# Patient Record
Sex: Female | Born: 1970
Health system: Southern US, Community
[De-identification: ages and names within clinical notes are randomized; demographics above are authoritative.]

## PROBLEM LIST (undated history)

## (undated) DIAGNOSIS — E7800 Pure hypercholesterolemia, unspecified: Secondary | ICD-10-CM

## (undated) DIAGNOSIS — IMO0002 Reserved for concepts with insufficient information to code with codable children: Secondary | ICD-10-CM

## (undated) HISTORY — DX: Pure hypercholesterolemia, unspecified: E78.00

## (undated) HISTORY — DX: Reserved for concepts with insufficient information to code with codable children: IMO0002

---

## 1993-01-29 HISTORY — PX: BREAST LUMPECTOMY: SHX2

## 2001-02-11 ENCOUNTER — Other Ambulatory Visit: Admission: RE | Admit: 2001-02-11 | Discharge: 2001-02-11 | Payer: Self-pay | Admitting: Gynecology

## 2002-03-02 ENCOUNTER — Other Ambulatory Visit: Admission: RE | Admit: 2002-03-02 | Discharge: 2002-03-02 | Payer: Self-pay | Admitting: Obstetrics and Gynecology

## 2002-11-13 ENCOUNTER — Encounter: Payer: Self-pay | Admitting: Obstetrics and Gynecology

## 2002-11-13 ENCOUNTER — Ambulatory Visit (HOSPITAL_COMMUNITY): Admission: RE | Admit: 2002-11-13 | Discharge: 2002-11-13 | Payer: Self-pay | Admitting: Obstetrics and Gynecology

## 2003-04-15 ENCOUNTER — Inpatient Hospital Stay (HOSPITAL_COMMUNITY): Admission: AD | Admit: 2003-04-15 | Discharge: 2003-04-17 | Payer: Self-pay | Admitting: Obstetrics and Gynecology

## 2003-04-21 ENCOUNTER — Encounter: Admission: RE | Admit: 2003-04-21 | Discharge: 2003-05-21 | Payer: Self-pay | Admitting: Obstetrics and Gynecology

## 2003-05-22 ENCOUNTER — Encounter: Admission: RE | Admit: 2003-05-22 | Discharge: 2003-06-21 | Payer: Self-pay | Admitting: Obstetrics and Gynecology

## 2003-05-26 ENCOUNTER — Other Ambulatory Visit: Admission: RE | Admit: 2003-05-26 | Discharge: 2003-05-26 | Payer: Self-pay | Admitting: Obstetrics and Gynecology

## 2004-01-11 ENCOUNTER — Emergency Department (HOSPITAL_COMMUNITY): Admission: EM | Admit: 2004-01-11 | Discharge: 2004-01-12 | Payer: Self-pay | Admitting: Emergency Medicine

## 2004-04-12 ENCOUNTER — Other Ambulatory Visit: Admission: RE | Admit: 2004-04-12 | Discharge: 2004-04-12 | Payer: Self-pay | Admitting: Obstetrics and Gynecology

## 2005-03-16 ENCOUNTER — Other Ambulatory Visit: Admission: RE | Admit: 2005-03-16 | Discharge: 2005-03-16 | Payer: Self-pay | Admitting: Obstetrics and Gynecology

## 2006-02-11 ENCOUNTER — Inpatient Hospital Stay (HOSPITAL_COMMUNITY): Admission: AD | Admit: 2006-02-11 | Discharge: 2006-02-11 | Payer: Self-pay | Admitting: Obstetrics and Gynecology

## 2006-02-18 ENCOUNTER — Inpatient Hospital Stay (HOSPITAL_COMMUNITY): Admission: AD | Admit: 2006-02-18 | Discharge: 2006-02-20 | Payer: Self-pay | Admitting: Obstetrics and Gynecology

## 2006-02-21 ENCOUNTER — Encounter: Admission: RE | Admit: 2006-02-21 | Discharge: 2006-03-14 | Payer: Self-pay | Admitting: Obstetrics and Gynecology

## 2007-12-28 ENCOUNTER — Emergency Department (HOSPITAL_COMMUNITY): Admission: EM | Admit: 2007-12-28 | Discharge: 2007-12-28 | Payer: Self-pay | Admitting: Family Medicine

## 2008-11-15 ENCOUNTER — Encounter: Admission: RE | Admit: 2008-11-15 | Discharge: 2008-11-15 | Payer: Self-pay | Admitting: Family Medicine

## 2008-11-23 ENCOUNTER — Encounter: Admission: RE | Admit: 2008-11-23 | Discharge: 2008-11-23 | Payer: Self-pay | Admitting: Family Medicine

## 2008-12-21 ENCOUNTER — Encounter: Admission: RE | Admit: 2008-12-21 | Discharge: 2008-12-21 | Payer: Self-pay | Admitting: Orthopedic Surgery

## 2009-01-03 ENCOUNTER — Encounter: Admission: RE | Admit: 2009-01-03 | Discharge: 2009-01-03 | Payer: Self-pay | Admitting: Orthopedic Surgery

## 2009-12-13 ENCOUNTER — Encounter: Admission: RE | Admit: 2009-12-13 | Discharge: 2009-12-13 | Payer: Self-pay | Admitting: Orthopedic Surgery

## 2009-12-27 ENCOUNTER — Encounter: Admission: RE | Admit: 2009-12-27 | Discharge: 2009-12-27 | Payer: Self-pay | Admitting: Orthopedic Surgery

## 2010-06-16 NOTE — H&P (Signed)
[name], [name]                          ACCOUNT NO.:  [id number]   MEDICAL RECORD NO.:  [id number]                   PATIENT TYPE:  MAT   LOCATION:  [facility]                                 [facility]ILITY:  [facility]   PHYSICIAN:  Crist Fat. Rivard, M.D.              DATE OF BIRTH:  October 26, 1970   DATE OF ADMISSION:  04/15/2003  DATE OF DISCHARGE:                                HISTORY & PHYSICAL   The patient is to be admitted on April 15, 2003, for induction of labor on  the birthing suite.   Michele [name] is a 40 year old gravida 1, para 0, at 40-4/7 weeks, who presents  today for induction secondary to term and mild PIH.  Her has been remarkable  for:   1. Abnormal last menstrual period.  2. Increased Down syndrome risk on quadruple screen with normal ultrasound     but amniocentesis declined.  3. Positive group B strep.    PRENATAL LABORATORY DATA:  Blood type is A positive.  Rh antibody negative.  VDRL nonreactive.  Rubella titer positive.  Hepatitis B surface antigen  negative.  Cystic fibrosis testing was negative.  Pap was normal.  Glucose  challenge was normal.  The patient had an abnormal quadruple screen with an  elevated Down syndrome risk; however, she had a normal ultrasound and  declined amniocentesis.  Her Glucola was normal.  Group B strep culture was  positive at 36 weeks.  Cultures were also negative at 36 weeks.  EDC of  April 11, 2003, was established by last menstrual period and was in  agreement with ultrasound at  12 and 18 weeks.  Hemoglobin upon entering the  practice was 13.4.  It was 11.5 at 28 weeks.   HISTORY OF PRESENT PREGNANCY:  The patient entered care at approximately 10  weeks.  She had an ultrasound at 12 weeks for dating secondary to abnormal  LMP.  She had another ultrasound at 12-1/2 weeks secondary to an inability  to hear fetal heart tones due to retroverted uterus.  Quadruple screen was  done.  It did show elevated Down syndrome risk.  She then had a  level 2  ultrasound at [facility], which was normal.  She declined  amniocentesis.  She had a Glucola that was normal.  At 26 weeks she had an  evaluation for slight erythema from her waistline to the hips.  This was  determined to be within normal limits.  She had a normal Glucola.  At  approximately 35-36 weeks she began to have slight elevations of her blood  pressure in the high 80s to low 90s diastolic.  She never had protein in her  urine.  The decision was made to plan induction at 40-4/7 weeks.   PAST OBSTETRICAL HISTORY:  The patient is a primigravida.   PAST MEDICAL HISTORY:  She was a condom and OvCon-35 user.  She had an  abnormal Pap six years ago and had a colposcopy.  She has had normal Paps  since.  She has infrequent yeast infections.  She reports the usual  childhood illnesses.  She has occasional history of UTIs.   She has no known medication allergies.   FAMILY HISTORY:  Her aunt had breast cancer.  Her mother has a history of  depression.   GENETIC HISTORY:  Remarkable for the father of the baby having scoliosis and  the patient's aunt having multiple sclerosis.   The patient has been followed by the physician service at [facility]  She denies any alcohol, drug, or tobacco use during this pregnancy.   PHYSICAL EXAMINATION:  VITAL SIGNS:  Blood pressure in the office on last  exam was 152/86, weight was 212.  Other vital signs are stable.  HEENT:  Within normal limits.  CHEST:  Bilateral breath sounds are clear.  CARDIAC:  Regular rate and rhythm without murmur.  BREASTS:  Soft and nontender.  ABDOMEN:  Fundal height is approximately 39 cm and is gravid.  Fetal heart  tones have been in the 150s in the office.  There have been occasional  uterine contractions noted.  EXTREMITIES:  Deep tendon reflexes are 2+ without clonus.  There is no edema  noted.  PELVIC:  Last documented was closed, 50%, vertex at a -1 at approximately 39  weeks.    IMPRESSION:  1. Intrauterine pregnancy at 40-4/7 weeks.  2. Mild elevation of blood pressure.  3. Slight post dates.  4. Positive group B Streptococcus.  5. Increased risk of Down syndrome on quadruple screen.   PLAN:  1. Admit to birthing suite per consult with Dr. Silverio Lay as attending     physician.  2. Routine physician orders.  3. Dr. Estanislado Pandy will determine mode of induction but will likely include     Pitocin induction of labor.     Michele [name], C.N.M.                   Crist Fat Rivard, M.D.    Leeanne Mannan  D:  04/14/2003  T:  04/14/2003  Job:  [id number]

## 2010-06-16 NOTE — H&P (Signed)
NAMEMARVIS, [name]              ACCOUNT NO.:  [id number]   MEDICAL RECORD NO.:  [id number]          PATIENT TYPE:  INP   LOCATION:  9164                          [facility]ILITY:  [facility]   PHYSICIAN:  Hal Morales, M.D.DATE OF BIRTH:  08-14-1970   DATE OF ADMISSION:  02/18/2006  DATE OF DISCHARGE:                              HISTORY & PHYSICAL   Michele [name] is a 40 year old, married, white female, gravida 2, para 1-0-0-  1, at 39-4/7 weeks who presents with ruptures membranes with clear  fluids since 11:50 p.m. tonight followed by regular contractions. She  reports positive fetal movement and no signs or symptoms of PIH, no  nausea or vomiting, or diarrhea.  Her pregnancy has been followed by the  [facility] OB[facility] MD service and has been remarkable for:  1. Advanced maternal age.  2. Conception just after discontinuing oral contraception.  3. History of borderline hypertension.  4. Group B strep positive.   PRENATAL LABS:  Collected on July 09, 2005. Hemoglobin 14.5, hematocrit  45.5, platelets 275,000, blood type A positive, antibody negative.  RPR  nonreactive.  Rubella immune.  Hepatitis B surface antigen negative.  Cystic fibrosis negative from 2005.  Pap smear within normal limits from  February 2007.  First trimester screening was normal in July 2007.  One-  hour Glucola from November 28, 2005 was 119.  Culture of the vaginal  tract for group B strep, gonorrhea and Chlamydia from January 25, 2006.  Group B strep was positive. Gonorrhea and Chlamydia were negative.   HISTORY OF PRESENT PREGNANCY:  The patient presented for care at [facility] on July 09, 2005 at 7-4/[redacted] weeks gestation.  This was done by  ultrasound at this first visit.  The patient declined amniocentesis. The  patient had first trimester screening at [facility] which was  normal.  Ultrasonography at [redacted] weeks gestation shows growth consistent  with previous dating, confirming [facility] of February 21, 2006.  The patient  had normal AFP drawn at [redacted] weeks gestation.  The rest of her prenatal  care has been unremarkable.   OB HISTORY:  She is a gravida 2, para 1-0-0-1.  In March 2005, she had a  vaginal delivery of a female infant weighing 8 pounds at 40-4/[redacted] weeks  gestation after eight hours of labor.  She had an epidural for  anesthesia.  Infant's name was Fredric Mare. She had group B strep with that  pregnancy.  She was induced and she had a partial third degree  laceration. She also had mild elevated blood pressure in the third  trimester that did not require treatment.   ALLERGIES:  She has no medications allergies.   PAST MEDICAL HISTORY:  She experience menarche at the age of 34 with 28-  day cycles lasting three days.  She stopped oral contraceptives at the  end of March due to slightly elevated blood pressure.  She had an  abnormal Pap smear approximately eight years ago with normal  colposcopies and  normal Pap smear since.  She reports having had the  usual childhood illnesses.  She has occasional urinary tract infection.   PAST SURGICAL HISTORY:  Negative.   FAMILY HISTORY:  Remarkable for mother with hypertension, paternal aunt  with breast cancer, mother with history of depression.   GENETIC HISTORY:  Remarkable for the patient at age 17 at the time of  delivery and the father of the baby's aunt with MS.   SOCIAL HISTORY:  The patient is married to the father of the baby.  His  name is Thayer Ohm.  He is involved and supportive.  The patient has 13-1/2  years of education and is employed full-time at [employer].  Father of the baby has 14 years of education and is employed full-time  as a Company secretary.  They deny any alcohol, tobacco or illicit drug use with  the pregnancy.   PHYSICAL EXAMINATION:  VITAL SIGNS:  Blood pressure 128/72.  Other vital  signs are stable.  She is afebrile.  HEENT:  Grossly within normal limits.  CHEST:  Clear to auscultation.  HEART:  Regular  rate and rhythm.  ABDOMEN:  Gravid in contour.  Fundal height extending approximately 39  cm by pubic symphysis.  Fetal heart rate is reassuring in the 140s.  Positive accelerations.  No decelerations.  Contractions are every three  minutes.  PELVIC:  Cervix is 3-4 cm, 70%, effaced.  Vertex minus 2.  With copious  clear fluid present.  EXTREMITIES:  Remarkable for trace edema.   ASSESSMENT:  1. Intrauterine pregnancy at term.  2. Spontaneous rupture of membranes.  3. Early labor.  4. Group B strep positive   PLAN:  1. Admit to the birthing suite.  2. Routine MD orders.  3. Plan penicillin G for group B strep prophylaxis.  4. Anticipate normal spontaneous vaginal birth.      Cam Hai, C.N.M.      Hal Morales, M.D.  Electronically Signed    KS/MEDQ  D:  02/18/2006  T:  02/18/2006  Job:  [id number]

## 2010-11-02 ENCOUNTER — Other Ambulatory Visit: Payer: Self-pay | Admitting: Family Medicine

## 2010-11-02 DIAGNOSIS — R42 Dizziness and giddiness: Secondary | ICD-10-CM

## 2010-11-03 ENCOUNTER — Ambulatory Visit
Admission: RE | Admit: 2010-11-03 | Discharge: 2010-11-03 | Disposition: A | Discharge status: Home / Self Care | Payer: 59 | Source: Ambulatory Visit | Attending: Family Medicine | Admitting: Family Medicine

## 2010-11-03 DIAGNOSIS — R42 Dizziness and giddiness: Secondary | ICD-10-CM

## 2011-05-31 ENCOUNTER — Ambulatory Visit: Payer: Self-pay | Admitting: Obstetrics and Gynecology

## 2011-06-07 ENCOUNTER — Other Ambulatory Visit (HOSPITAL_COMMUNITY): Payer: Self-pay | Admitting: Physician Assistant

## 2011-06-07 DIAGNOSIS — Z1231 Encounter for screening mammogram for malignant neoplasm of breast: Secondary | ICD-10-CM

## 2011-07-03 ENCOUNTER — Ambulatory Visit (HOSPITAL_COMMUNITY)
Admission: RE | Admit: 2011-07-03 | Discharge: 2011-07-03 | Disposition: A | Discharge status: Home / Self Care | Payer: 59 | Source: Ambulatory Visit | Attending: Physician Assistant | Admitting: Physician Assistant

## 2011-07-03 DIAGNOSIS — Z1231 Encounter for screening mammogram for malignant neoplasm of breast: Secondary | ICD-10-CM | POA: Insufficient documentation

## 2011-10-04 ENCOUNTER — Other Ambulatory Visit: Payer: Self-pay

## 2011-10-04 MED ORDER — BUPROPION HCL ER (XL) 300 MG PO TB24: 300.0000 mg | ORAL_TABLET | Freq: Every day | ORAL | Status: DC

## 2011-10-04 NOTE — Telephone Encounter (Signed)
Bupropion xl 300mg  faxed to [facility] [address].  Number 30 only no refills.  Pt needs AEX.  ld

## 2011-11-14 ENCOUNTER — Ambulatory Visit (INDEPENDENT_AMBULATORY_CARE_PROVIDER_SITE_OTHER): Payer: 59 | Admitting: Obstetrics and Gynecology

## 2011-11-14 ENCOUNTER — Encounter: Payer: Self-pay | Admitting: Obstetrics and Gynecology

## 2011-11-14 VITALS — BP 140/70 | Wt 195.0 lb

## 2011-11-14 DIAGNOSIS — E7800 Pure hypercholesterolemia, unspecified: Secondary | ICD-10-CM | POA: Insufficient documentation

## 2011-11-14 DIAGNOSIS — Z01419 Encounter for gynecological examination (general) (routine) without abnormal findings: Secondary | ICD-10-CM

## 2011-11-14 DIAGNOSIS — IMO0002 Reserved for concepts with insufficient information to code with codable children: Secondary | ICD-10-CM | POA: Insufficient documentation

## 2011-11-14 DIAGNOSIS — Z124 Encounter for screening for malignant neoplasm of cervix: Secondary | ICD-10-CM

## 2011-11-14 MED ORDER — BUPROPION HCL ER (XL) 300 MG PO TB24: 300.0000 mg | ORAL_TABLET | Freq: Every day | ORAL | Status: AC

## 2011-11-14 NOTE — Progress Notes (Signed)
The patient reports:no complaints  Contraception:vasectomy  Last mammogram: was normal August 2013 Last pap: was normal May  2012   GC/Chlamydia cultures offered: declined HIV/RPR/HbsAg offered:  declined HSV 1 and 2 glycoprotein offered: declined  Menstrual cycle regular and monthly: Yes Menstrual flow normal: Yes  Urinary symptoms: none Normal bowel movements: Yes Reports abuse at home: No  Subjective:    Michele [name] is a 41 y.o. female, G2P2, who presents for an annual exam.     History   Social History  . Marital Status: Married    Spouse Name: N/A    Number of Children: N/A  . Years of Education: N/A   Social History Main Topics  . Smoking status: Never Smoker   . Smokeless tobacco: Never Used  . Alcohol Use: No  . Drug Use: No  . Sexually Active: Yes    Birth Control/ Protection: Surgical     VAS   Other Topics Concern  . None   Social History Narrative  . None    Menstrual cycle:   LMP: Patient's last menstrual period was 10/24/2011.           Cycle: normal  The following portions of the patient's history were reviewed and updated as appropriate: allergies, current medications, past family history, past medical history, past social history, past surgical history and problem list.  Review of Systems Pertinent items are noted in HPI. Breast:Negative for breast lump,nipple discharge or nipple retraction Gastrointestinal: Negative for abdominal pain, change in bowel habits or rectal bleeding Urinary:negative   Objective:    BP 140/70  Wt 195 lb (88.451 kg)  LMP 10/24/2011    Weight:  Wt Readings from Last 1 Encounters:  11/14/11 195 lb (88.451 kg)          BMI: There is no height on file to calculate BMI.  General Appearance: Alert, appropriate appearance for age. No acute distress HEENT: Grossly normal Neck / Thyroid: Supple, no masses, nodes or enlargement Lungs: clear to auscultation bilaterally Back: No CVA tenderness Breast Exam: No  masses or nodes.No dimpling, nipple retraction or discharge. Cardiovascular: Regular rate and rhythm. S1, S2, no murmur Gastrointestinal: Soft, non-tender, no masses or organomegaly Pelvic Exam: Vulva and vagina appear normal. Bimanual exam reveals normal uterus and adnexa. Rectovaginal: normal rectal, no masses Lymphatic Exam: Non-palpable nodes in neck, clavicular, axillary, or inguinal regions Skin: no rash or abnormalities Neurologic: Normal gait and speech, no tremor  Psychiatric: Alert and oriented, appropriate affect.     Assessment:    Normal gyn exam    Plan:    mammogram pap smear return annually or prn STD screening: declined    Silverio Lay MD

## 2012-10-14 ENCOUNTER — Other Ambulatory Visit (HOSPITAL_COMMUNITY): Payer: Self-pay | Admitting: Family Medicine

## 2012-10-14 DIAGNOSIS — Z1231 Encounter for screening mammogram for malignant neoplasm of breast: Secondary | ICD-10-CM

## 2012-10-17 ENCOUNTER — Ambulatory Visit (HOSPITAL_COMMUNITY)
Admission: RE | Admit: 2012-10-17 | Discharge: 2012-10-17 | Disposition: A | Discharge status: Home / Self Care | Payer: 59 | Source: Ambulatory Visit | Attending: Family Medicine | Admitting: Family Medicine

## 2012-10-17 DIAGNOSIS — Z1231 Encounter for screening mammogram for malignant neoplasm of breast: Secondary | ICD-10-CM | POA: Insufficient documentation

## 2013-10-30 ENCOUNTER — Other Ambulatory Visit (HOSPITAL_COMMUNITY): Payer: Self-pay | Admitting: Obstetrics and Gynecology

## 2013-10-30 DIAGNOSIS — Z1231 Encounter for screening mammogram for malignant neoplasm of breast: Secondary | ICD-10-CM

## 2013-11-06 ENCOUNTER — Ambulatory Visit (HOSPITAL_COMMUNITY)
Admission: RE | Admit: 2013-11-06 | Discharge: 2013-11-06 | Disposition: A | Discharge status: Home / Self Care | Payer: 59 | Source: Ambulatory Visit | Attending: Obstetrics and Gynecology | Admitting: Obstetrics and Gynecology

## 2013-11-06 DIAGNOSIS — Z1231 Encounter for screening mammogram for malignant neoplasm of breast: Secondary | ICD-10-CM | POA: Insufficient documentation

## 2013-11-30 ENCOUNTER — Encounter: Payer: Self-pay | Admitting: Obstetrics and Gynecology

## 2014-11-15 ENCOUNTER — Other Ambulatory Visit: Payer: Self-pay | Admitting: Orthopedic Surgery

## 2014-11-15 DIAGNOSIS — IMO0002 Reserved for concepts with insufficient information to code with codable children: Secondary | ICD-10-CM

## 2014-11-15 DIAGNOSIS — R229 Localized swelling, mass and lump, unspecified: Principal | ICD-10-CM

## 2014-11-30 ENCOUNTER — Inpatient Hospital Stay
Admission: RE | Admit: 2014-11-30 | Discharge: 2014-11-30 | Disposition: A | Discharge status: Home / Self Care | Payer: Self-pay | Source: Ambulatory Visit | Attending: Orthopedic Surgery | Admitting: Orthopedic Surgery

## 2014-12-04 ENCOUNTER — Ambulatory Visit
Admission: RE | Admit: 2014-12-04 | Discharge: 2014-12-04 | Disposition: A | Discharge status: Home / Self Care | Payer: Commercial Managed Care - HMO | Source: Ambulatory Visit | Attending: Orthopedic Surgery | Admitting: Orthopedic Surgery

## 2014-12-04 DIAGNOSIS — IMO0002 Reserved for concepts with insufficient information to code with codable children: Secondary | ICD-10-CM

## 2014-12-04 DIAGNOSIS — R229 Localized swelling, mass and lump, unspecified: Principal | ICD-10-CM

## 2016-04-12 ENCOUNTER — Other Ambulatory Visit: Payer: Self-pay | Admitting: Obstetrics and Gynecology

## 2016-04-12 DIAGNOSIS — N644 Mastodynia: Secondary | ICD-10-CM

## 2016-04-16 ENCOUNTER — Ambulatory Visit
Admission: RE | Admit: 2016-04-16 | Discharge: 2016-04-16 | Disposition: A | Discharge status: Home / Self Care | Payer: 59 | Source: Ambulatory Visit | Attending: Obstetrics and Gynecology | Admitting: Obstetrics and Gynecology

## 2016-04-16 ENCOUNTER — Other Ambulatory Visit: Payer: Self-pay | Admitting: Obstetrics and Gynecology

## 2016-04-16 DIAGNOSIS — N644 Mastodynia: Secondary | ICD-10-CM

## 2016-04-26 ENCOUNTER — Other Ambulatory Visit: Payer: Self-pay | Admitting: Surgery

## 2016-04-26 DIAGNOSIS — N6452 Nipple discharge: Secondary | ICD-10-CM | POA: Insufficient documentation

## 2016-05-11 ENCOUNTER — Other Ambulatory Visit: Payer: Self-pay | Admitting: Surgery

## 2016-05-11 DIAGNOSIS — N6452 Nipple discharge: Secondary | ICD-10-CM

## 2016-05-22 ENCOUNTER — Ambulatory Visit
Admission: RE | Admit: 2016-05-22 | Discharge: 2016-05-22 | Disposition: A | Discharge status: Home / Self Care | Payer: BLUE CROSS/BLUE SHIELD | Source: Ambulatory Visit | Attending: Surgery | Admitting: Surgery

## 2016-05-22 DIAGNOSIS — N6452 Nipple discharge: Secondary | ICD-10-CM

## 2016-05-22 MED ORDER — GADOBENATE DIMEGLUMINE 529 MG/ML IV SOLN
20.0000 mL | Freq: Once | INTRAVENOUS | Status: AC | PRN
  Administered 2016-05-22: 20 mL via INTRAVENOUS

## 2016-05-24 NOTE — Progress Notes (Signed)
Please call the patient and let them know that their MRI was normal.  I would be glad to see her in the office to discuss duct excision if she wants to pursue that for continued nipple drainage

## 2016-05-26 ENCOUNTER — Ambulatory Visit: Payer: Self-pay | Admitting: Surgery

## 2016-05-26 NOTE — H&P (Signed)
History of Present Illness Michele [name]. [name] MD; 05/26/2016 10:32 PM) The patient is a 46 year old female who presents with a complaint of nipple discharge. Referred by Dr. [name] for right nipple discharge. GYN - Rivard  This is a 46 year old female who recently underwent routine screening mammogram in December was unremarkable. Over the last several weeks, she has noticed some clear nipple discharge spontaneously draining from her right breast. The patient reports twinges of sharp pain radiating toward her right nipple lasting 5-10 minutes. The pain will spontaneously resolve and then the patient notices some clear drainage shortly after. She underwent a diagnostic mammogram and ultrasound on 04/16/16. This showed no evidence for malignancy. She has a simple cyst measuring 1 cm in greatest dimension located at 2:00 of the right breast 2 cm from the nipple. There are no dilated ducts seen.  The patient was seen 04/26/16 and her examination was unremarkable except for a prominent central duct in the right nipple with some whitish drainage. She underwent MRI of both breasts which were also unremarkable.  Menarche age 68 First pregnancy age 44 Breast-feed no Oral contraceptives 15 years Last menstrual period 2 weeks ago. Family history a maternal aunt had cancer at age 67   CLINICAL DATA: Patient reports a 2 week history of spontaneous intermittent clear nipple discharge from the right breast.  EXAM: 2D DIGITAL DIAGNOSTIC RIGHT MAMMOGRAM WITH CAD AND ADJUNCT TOMO  ULTRASOUND RIGHT BREAST  COMPARISON: To 01/10/2016 and earlier  ACR Breast Density Category c: The breast tissue is heterogeneously dense, which may obscure small masses.  FINDINGS: There is a circumscribed partially obscured oval mass in the medial aspect of the right breast, further evaluated with ultrasound. No suspicious distortion or microcalcifications are present.  Mammographic images were processed with  CAD.  On physical exam, I palpate no abnormality in the central portion of the right breast. I palpate no abnormality medial aspect of the right breast. On physical exam, no discharge can be expressed from the right nipple.  Targeted ultrasound is performed, showing normal appearing fibroglandular tissue in the retroareolar region of the right breast. No duct ectasia or intraductal mass identified. Note is made of a simple cyst in the 2 o'clock location of the right breast 2 cm from the nipple measuring 1.0 x 0.8 x 0.3 cm, accounting for the mammographic finding.  IMPRESSION: 1. No mammographic or ultrasound evidence for malignancy. 2. Simple cyst in the 2 o'clock location of the right breast. 3. No mammographic or sonographic findings to account for the patient's spontaneous discharge. Further evaluation is indicated to exclude malignancy or high risk lesion.  RECOMMENDATION: Breast MRI to evaluate potential site for biopsy.  Surgical consultation to discuss duct excision.  I have discussed the findings and recommendations with the patient and her husband. Results were also provided in writing at the conclusion of the visit. If applicable, a reminder letter will be sent to the patient regarding the next appointment.  BI-RADS CATEGORY 4: Suspicious.   Electronically Signed By: [name] M.D. On: 04/16/2016 15:46  Study Result  CLINICAL DATA: Spontaneous clear and milky nipple discharge from the right nipple for 2 months. Family history of breast cancer in her maternal aunt, diagnosed at age 46.  LABS: Not applicable  EXAM: BILATERAL BREAST MRI WITH AND WITHOUT CONTRAST  TECHNIQUE: Multiplanar, multisequence MR images of both breasts were obtained prior to and following the intravenous administration of 20 ml of MultiHance.  THREE-DIMENSIONAL MR IMAGE RENDERING ON INDEPENDENT WORKSTATION:  Three-dimensional MR images were rendered by post-processing  of the original MR data on an independent workstation. The three-dimensional MR images were interpreted, and findings are reported in the following complete MRI report for this study. Three dimensional images were evaluated at the independent DynaCad workstation  COMPARISON: Mammogram and ultrasound 04/16/2016 and earlier  FINDINGS: Breast composition: c. Heterogeneous fibroglandular tissue.  Background parenchymal enhancement: Mild  Right breast: No mass or abnormal enhancement. Scattered cysts are present. Scattered cysts are present.  Left breast: No mass or abnormal enhancement.  Lymph nodes: No abnormal appearing lymph nodes.  Ancillary findings: None.  IMPRESSION: No MRI evidence for malignancy in either breast.  RECOMMENDATION: Follow-up with Dr. Corliss Skains regarding further recommendations. Screening mammogram is recommended in March of 2019.  BI-RADS CATEGORY 1: Negative.   Electronically Signed By: [name] M.D. On: 05/23/2016 10:35    Allergies [name], RMA; 05/25/2016 11:05 AM) CeleBREX *ANALGESICS - ANTI-INFLAMMATORY* Hives. Keflex *CEPHALOSPORINS* Nausea. pain  Medication History [name], RMA; 05/25/2016 11:06 AM) BuPROPion HCl ER (XL) (  Tablet ER 24HR, Oral) Active. Flonase (50MCG/ACT Suspension, Nasal) Active. Fish Oil (  Capsule, Oral) Active. Womens Multivitamin (Oral) Active. Meclizine HCl (  Tablet, Oral) Active. Xanax (0.5MG  Tablet, Oral as needed) Active. Medications Reconciled    Vitals [name] RMA; 05/25/2016 11:07 AM) 05/25/2016 11:06 AM Weight Measurement Declined Temp.: 98.55F  Pulse: 84 (Regular)  BP: 128/90 (Sitting, Left Arm, Standard)      Physical Exam [name] MD; 05/26/2016 10:33 PM)  The physical exam findings are as follows: Note:WDWN in NAD Eyes: Pupils equal, round; sclera anicteric HENT: Oral mucosa moist; good dentition Neck: No masses  palpated, no thyromegaly Lungs: CTA bilaterally; normal respiratory effort Breasts: symmetric; no axillary lymphadenopathy; no dominant masses; normal left nipple; right nipple shows some scaling and crusting at upper part of nipple. The central duct orifice seems mildly prominent CV: Regular rate and rhythm; no murmurs; extremities well-perfused with no edema Abd: +bowel sounds, soft, non-tender, no palpable organomegaly; no palpable hernias Skin: Warm, dry; no sign of jaundice Psychiatric - alert and oriented x 4; calm mood and affect    Assessment & Plan Molli Hazard K. Janiyla Long MD; 05/26/2016 10:35 PM)  DISCHARGE FROM RIGHT NIPPLE (N64.52)  Current Plans Schedule for Surgery - Right nipple duct excision. The surgical procedure has been discussed with the patient. Potential risks, benefits, alternative treatments, and expected outcomes have been explained. All of the patient's questions at this time have been answered. The likelihood of reaching the patient's treatment goal is good. The patient understand the proposed surgical procedure and wishes to proceed. Note:After extensive discussion with the patient and her husband, the only options remaining are rechecking in 3 months to see whether the discharge has resolved, or nipple duct excision. The patient is leaning towards duct excision, but wants to think about it over the weekend. We will proceed with scheduling and she will let us know if she changes her mind.   Michele [name]. Corliss Skains, MD, [facility]  General/ Trauma Surgery  05/26/2016 10:36 PM

## 2016-10-24 ENCOUNTER — Other Ambulatory Visit: Payer: Self-pay | Admitting: Family Medicine

## 2016-10-24 DIAGNOSIS — R519 Headache, unspecified: Secondary | ICD-10-CM

## 2016-10-24 DIAGNOSIS — R51 Headache: Principal | ICD-10-CM

## 2016-10-31 ENCOUNTER — Other Ambulatory Visit: Payer: BLUE CROSS/BLUE SHIELD

## 2017-02-22 DIAGNOSIS — Z0100 Encounter for examination of eyes and vision without abnormal findings: Secondary | ICD-10-CM | POA: Diagnosis not present

## 2017-03-07 DIAGNOSIS — J329 Chronic sinusitis, unspecified: Secondary | ICD-10-CM | POA: Diagnosis not present

## 2017-04-30 DIAGNOSIS — M545 Low back pain: Secondary | ICD-10-CM | POA: Diagnosis not present

## 2017-05-08 DIAGNOSIS — M545 Low back pain: Secondary | ICD-10-CM | POA: Diagnosis not present

## 2017-05-28 DIAGNOSIS — M5126 Other intervertebral disc displacement, lumbar region: Secondary | ICD-10-CM | POA: Diagnosis not present

## 2017-05-28 DIAGNOSIS — M25551 Pain in right hip: Secondary | ICD-10-CM | POA: Diagnosis not present

## 2017-05-30 DIAGNOSIS — M25551 Pain in right hip: Secondary | ICD-10-CM | POA: Diagnosis not present

## 2017-10-30 DIAGNOSIS — Z23 Encounter for immunization: Secondary | ICD-10-CM | POA: Diagnosis not present

## 2017-10-30 DIAGNOSIS — Z13220 Encounter for screening for lipoid disorders: Secondary | ICD-10-CM | POA: Diagnosis not present

## 2017-10-30 DIAGNOSIS — Z0000 Encounter for general adult medical examination without abnormal findings: Secondary | ICD-10-CM | POA: Diagnosis not present

## 2021-02-21 ENCOUNTER — Other Ambulatory Visit (HOSPITAL_COMMUNITY): Payer: Self-pay

## 2021-02-21 MED ORDER — AMPHETAMINE-DEXTROAMPHET ER 20 MG PO CP24
20.0000 mg | ORAL_CAPSULE | Freq: Every morning | ORAL | 0 refills | Status: AC
  Filled 2021-02-21: qty 30, 30d supply, fill #0

## 2021-03-01 ENCOUNTER — Other Ambulatory Visit (HOSPITAL_COMMUNITY): Payer: Self-pay

## 2021-03-06 ENCOUNTER — Other Ambulatory Visit: Payer: Self-pay | Admitting: Obstetrics and Gynecology

## 2021-03-06 DIAGNOSIS — R928 Other abnormal and inconclusive findings on diagnostic imaging of breast: Secondary | ICD-10-CM

## 2021-04-17 ENCOUNTER — Other Ambulatory Visit: Payer: Self-pay | Admitting: Obstetrics and Gynecology

## 2021-04-18 ENCOUNTER — Other Ambulatory Visit: Payer: Self-pay | Admitting: Obstetrics and Gynecology

## 2021-04-18 DIAGNOSIS — R928 Other abnormal and inconclusive findings on diagnostic imaging of breast: Secondary | ICD-10-CM

## 2021-04-29 ENCOUNTER — Ambulatory Visit
Admission: RE | Admit: 2021-04-29 | Discharge: 2021-04-29 | Disposition: A | Discharge status: Home / Self Care | Payer: No Typology Code available for payment source | Source: Ambulatory Visit | Attending: Obstetrics and Gynecology | Admitting: Obstetrics and Gynecology

## 2021-04-29 ENCOUNTER — Ambulatory Visit
Admission: RE | Admit: 2021-04-29 | Discharge: 2021-04-29 | Disposition: A | Discharge status: Home / Self Care | Payer: Self-pay | Source: Ambulatory Visit | Attending: Obstetrics and Gynecology | Admitting: Obstetrics and Gynecology

## 2021-04-29 DIAGNOSIS — R928 Other abnormal and inconclusive findings on diagnostic imaging of breast: Secondary | ICD-10-CM

## 2022-07-17 IMAGING — US US BREAST*L* LIMITED INC AXILLA
1 series · 12 of 12 positions shown · non-contrast
Comparison: Previous exam(s).

CLINICAL DATA: Patient recalled from screening for left breast
asymmetry.

EXAM:
DIGITAL DIAGNOSTIC UNILATERAL LEFT MAMMOGRAM WITH TOMOSYNTHESIS AND
CAD; ULTRASOUND LEFT BREAST LIMITED
TECHNIQUE: Left digital diagnostic mammography and breast tomosynthesis was
performed. The images were evaluated with computer-aided detection.;
Targeted ultrasound examination of the left breast was performed.

[Series 1: us breast*left* limited inc axilla · 0.07mm/px · 12 of 12 slices shown]
[im 1/12]
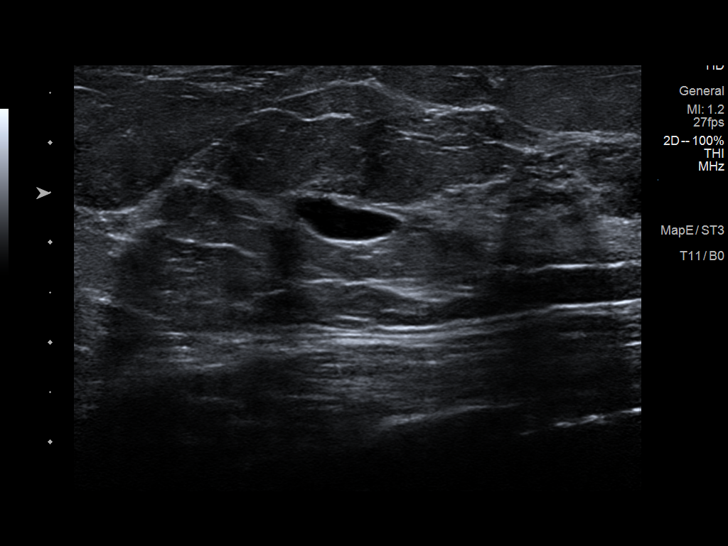
[im 2/12]
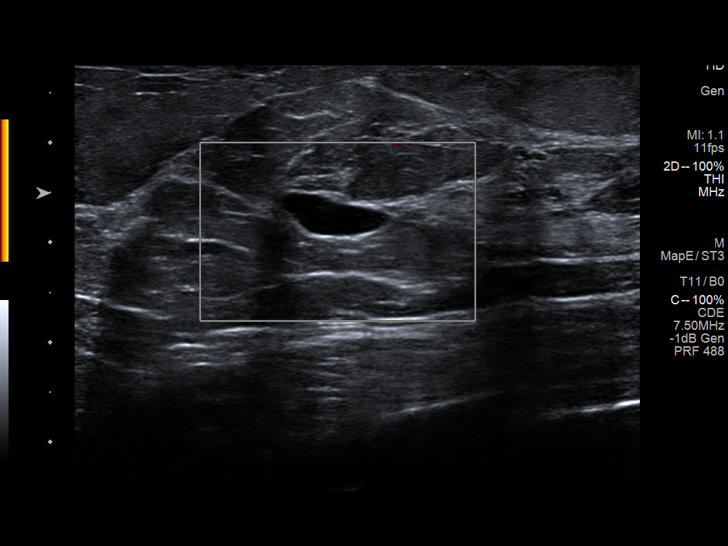
[im 3/12]
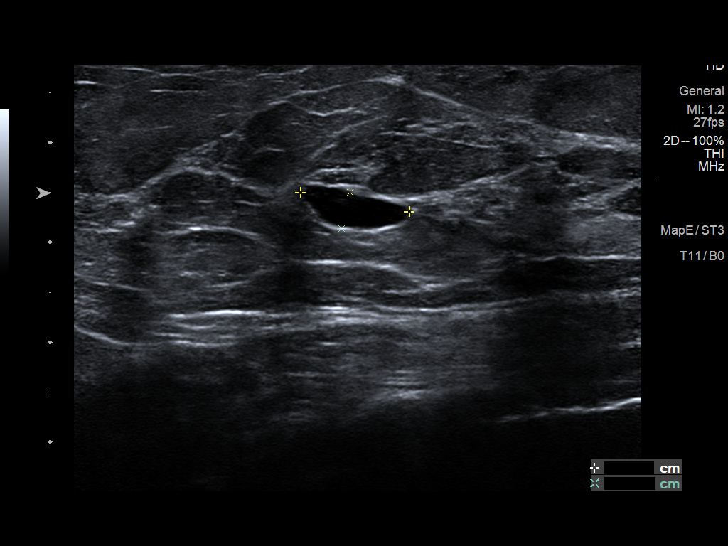
[im 4/12]
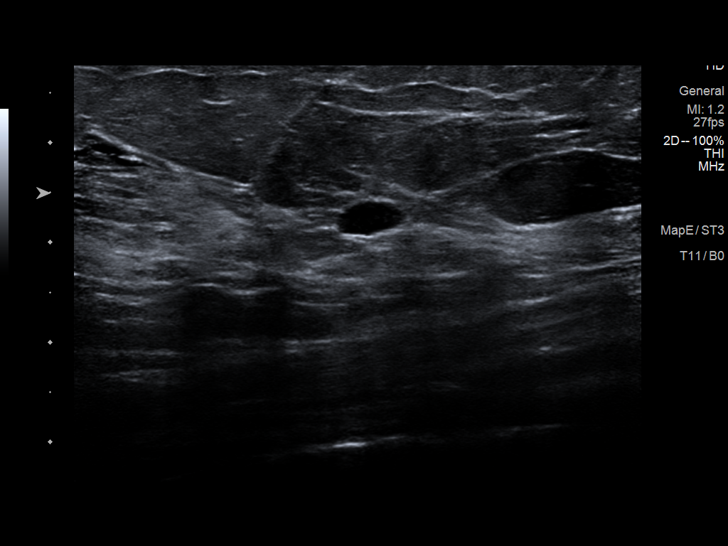
[im 5/12]
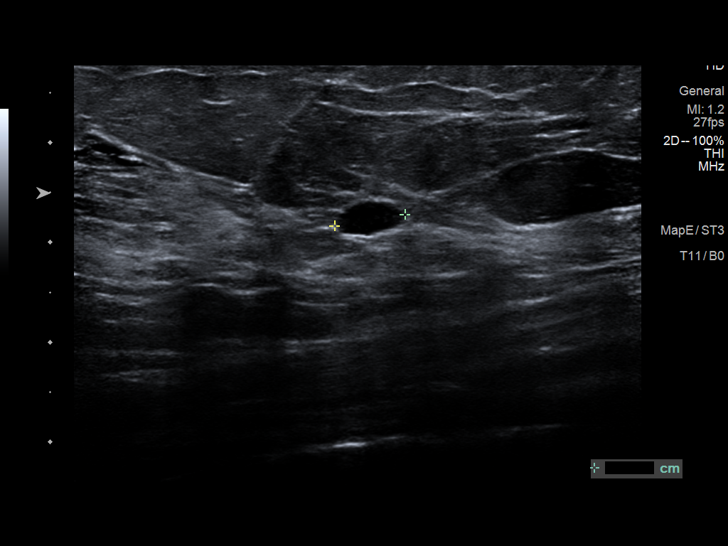
[im 6/12]
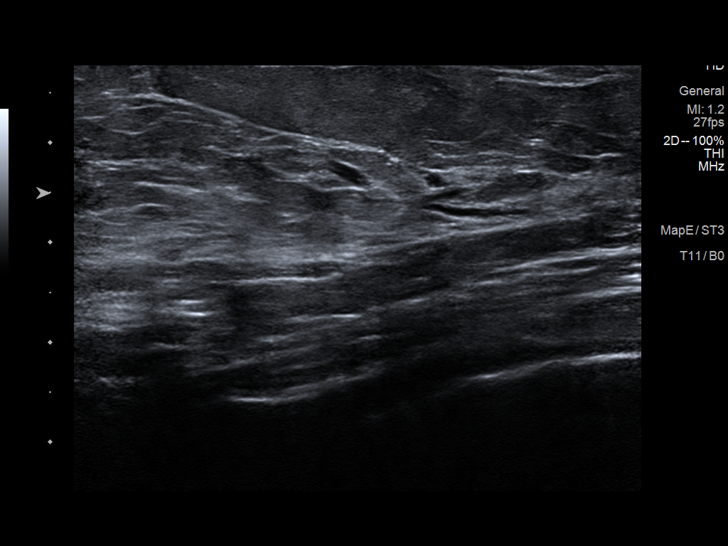
[im 7/12]
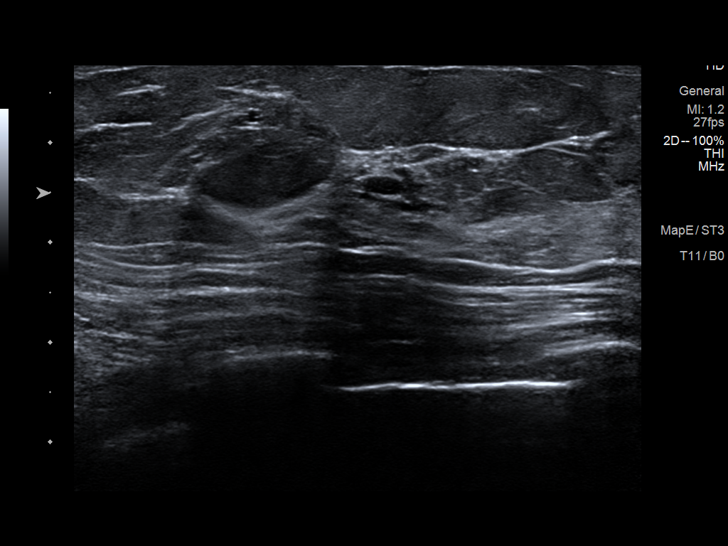
[im 8/12]
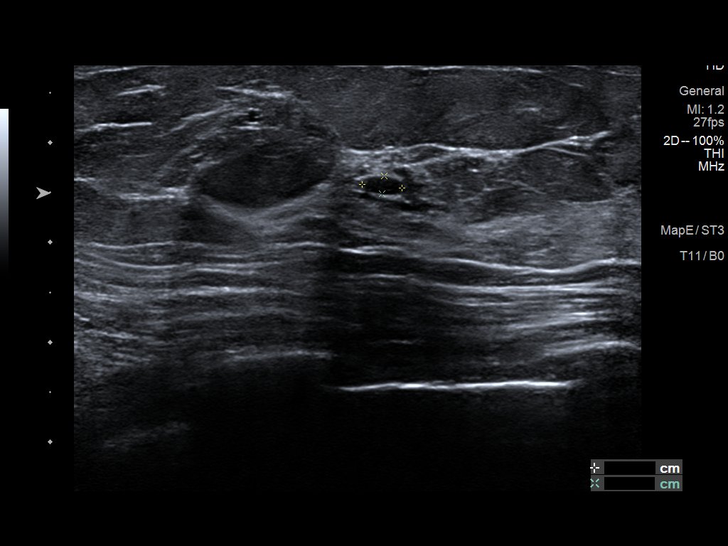
[im 9/12]
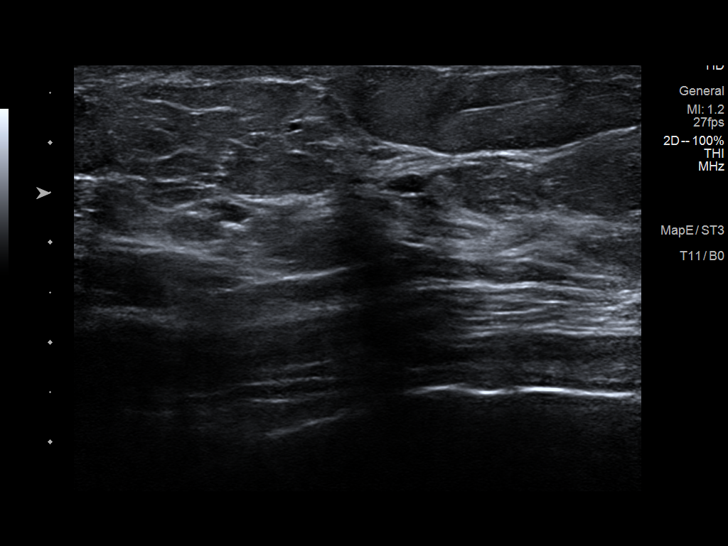
[im 10/12]
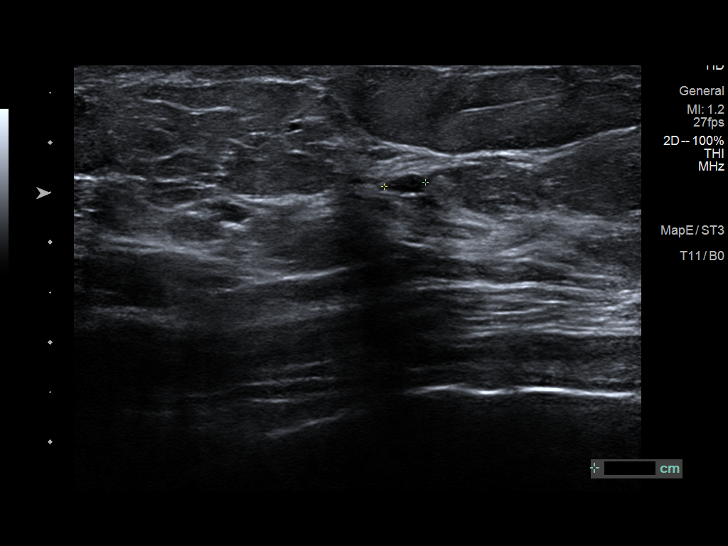
[im 11/12]
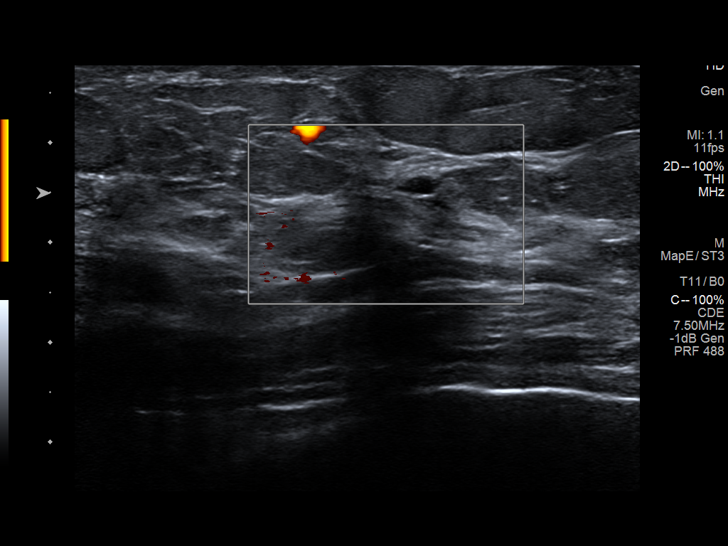
[im 12/12]
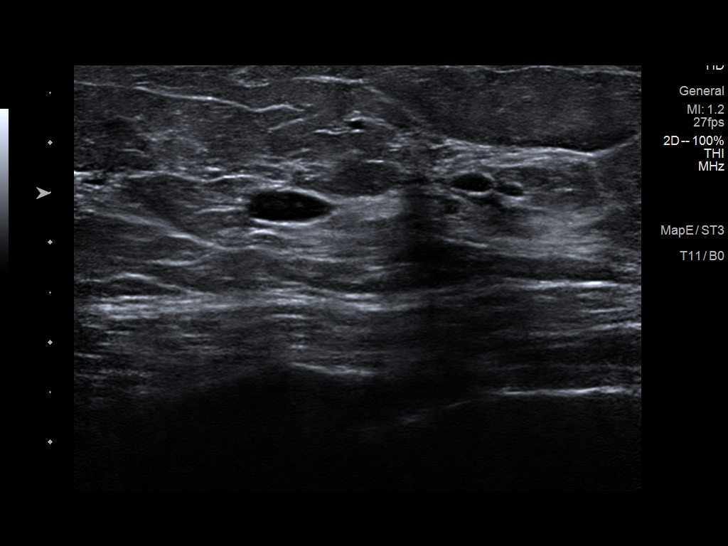

[12 of 12 positions shown; findings below may reference images not displayed]

ACR Breast Density Category c: The breast tissue is heterogeneously
dense, which may obscure small masses.
FINDINGS: Questioned asymmetry within the outer left breast on the cc view
appears to efface with additional imaging suggestive of dense
tissue. There is a persistent oval circumscribed mass within the
outer left breast middle depth.

Targeted ultrasound is performed, showing normal tissue without
suspicious mass within the outer left breast. Additionally there is
a 3 x 11 x 7 mm cyst left breast 2 o'clock position 5 cm from nipple
an adjacent 4 x 2 x 4 mm cyst left breast 2:30 o'clock 6 cm from the
nipple.
IMPRESSION: No mammographic evidence for malignancy.

RECOMMENDATION:
Screening mammogram in one year.(Code:4X-Q-WTL)

I have discussed the findings and recommendations with the patient.
If applicable, a reminder letter will be sent to the patient
regarding the next appointment.

BI-RADS CATEGORY  2: Benign.

## 2022-07-17 IMAGING — MG MM DIGITAL DIAGNOSTIC UNILAT*L* W/ TOMO W/ CAD
6 series · 6 of 18 positions shown · non-contrast
Comparison: Previous exam(s).

CLINICAL DATA: Patient recalled from screening for left breast
asymmetry.

EXAM:
DIGITAL DIAGNOSTIC UNILATERAL LEFT MAMMOGRAM WITH TOMOSYNTHESIS AND
CAD; ULTRASOUND LEFT BREAST LIMITED
TECHNIQUE: Left digital diagnostic mammography and breast tomosynthesis was
performed. The images were evaluated with computer-aided detection.;
Targeted ultrasound examination of the left breast was performed.

[L ML synth-2D]
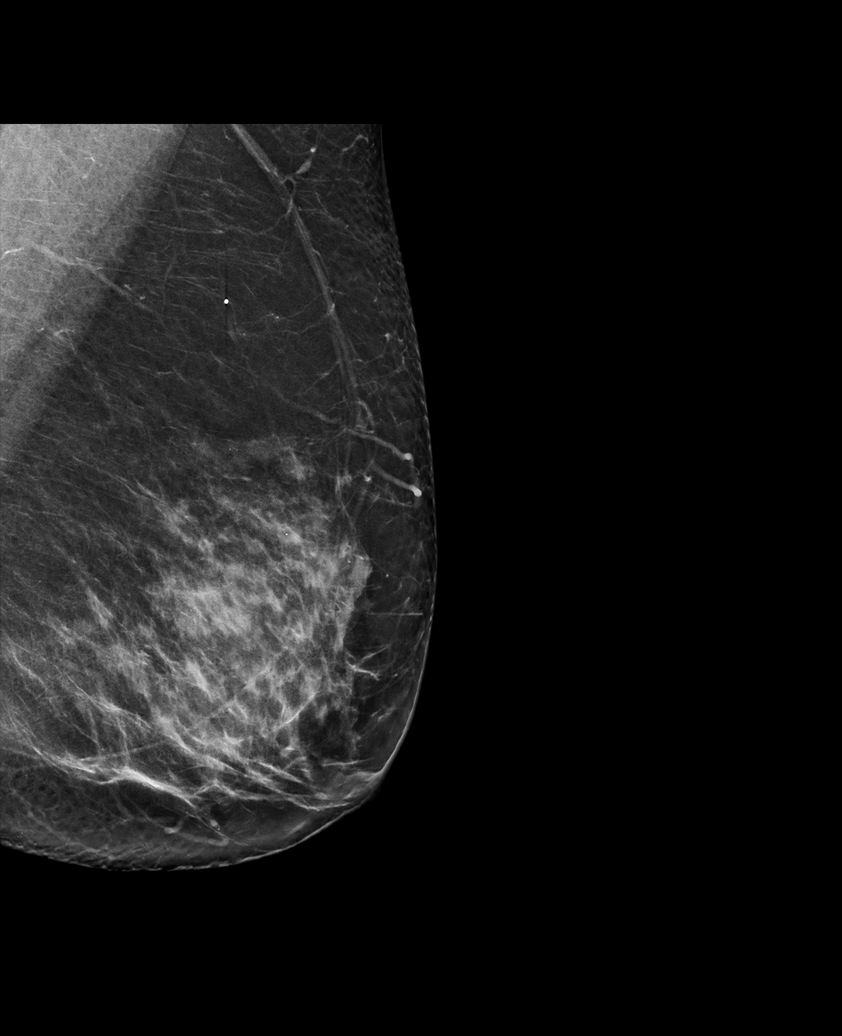

[L CC synth-2D (1 of 2)]
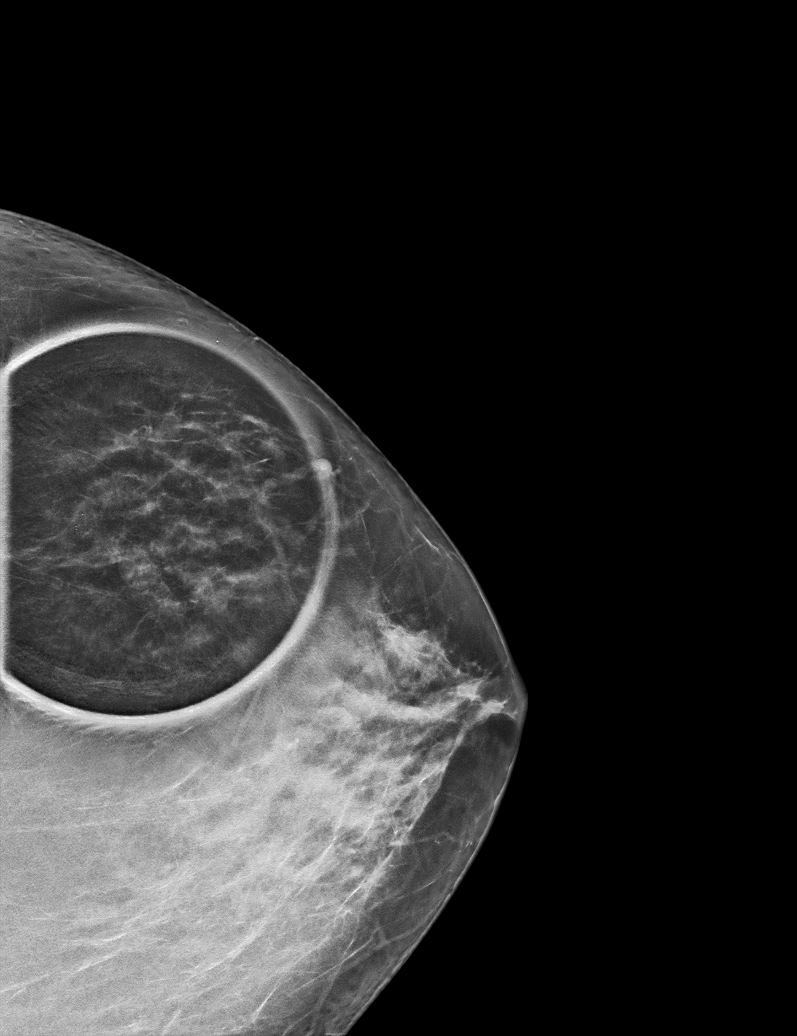

[L CC synth-2D (2 of 2)]
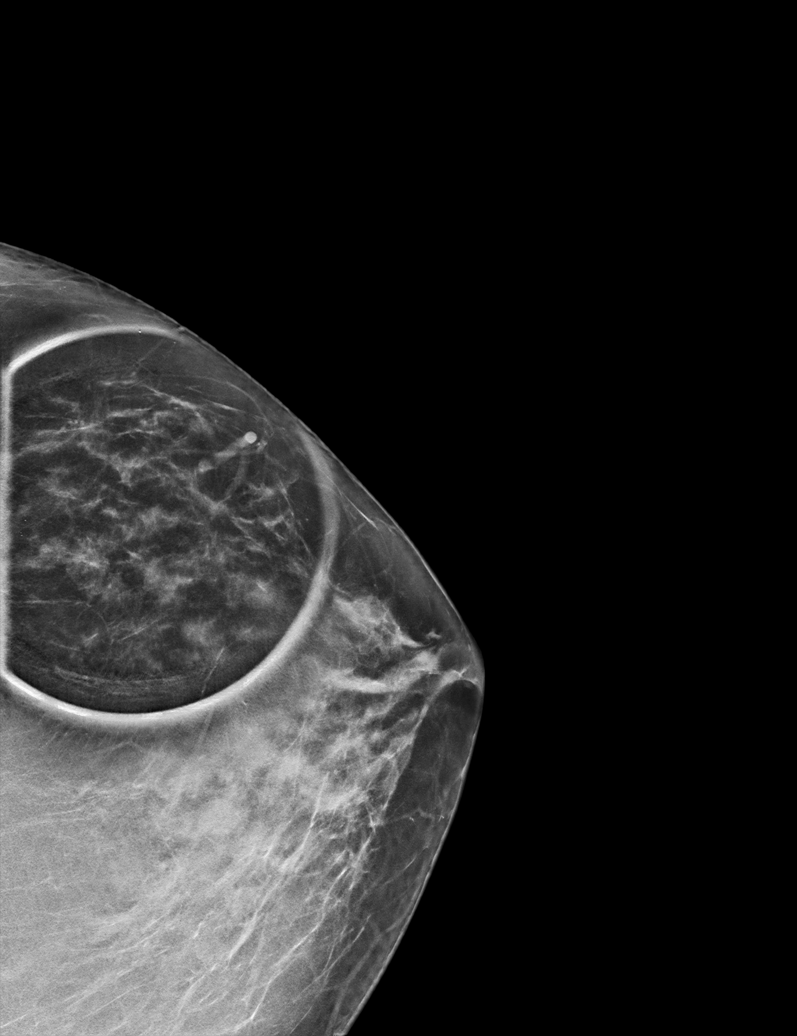

[L CC tomo (1 of 2) · tomo slice 31/61.0]
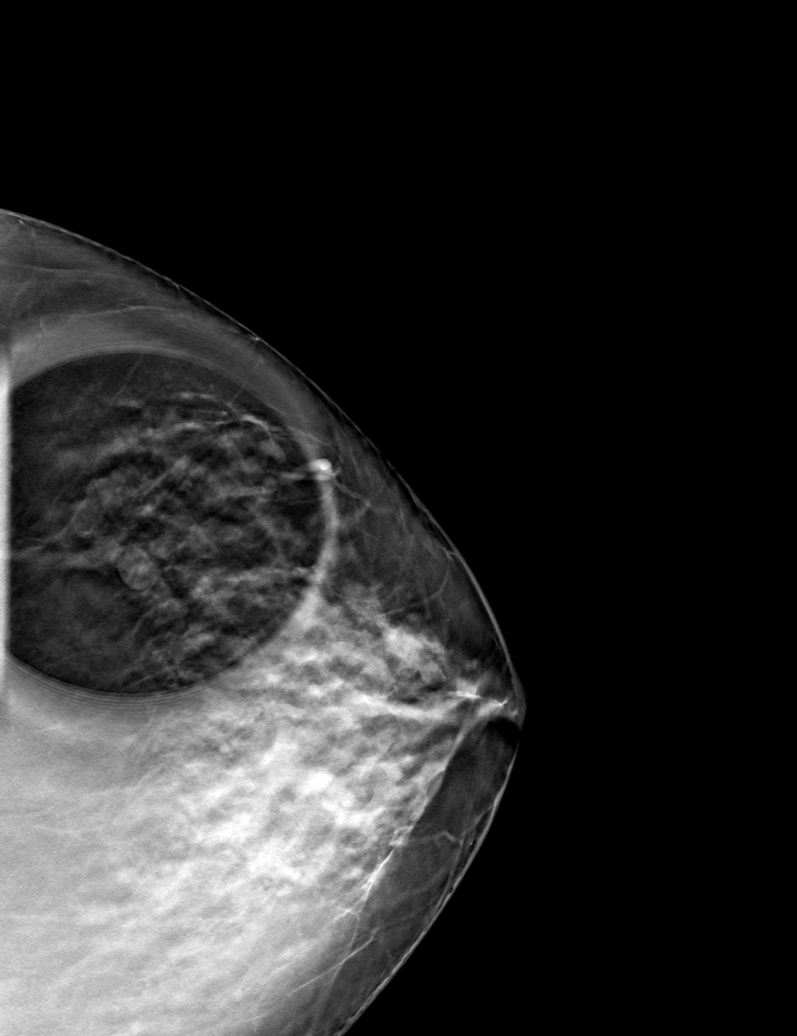

[L ML tomo · tomo slice 40/79.0]
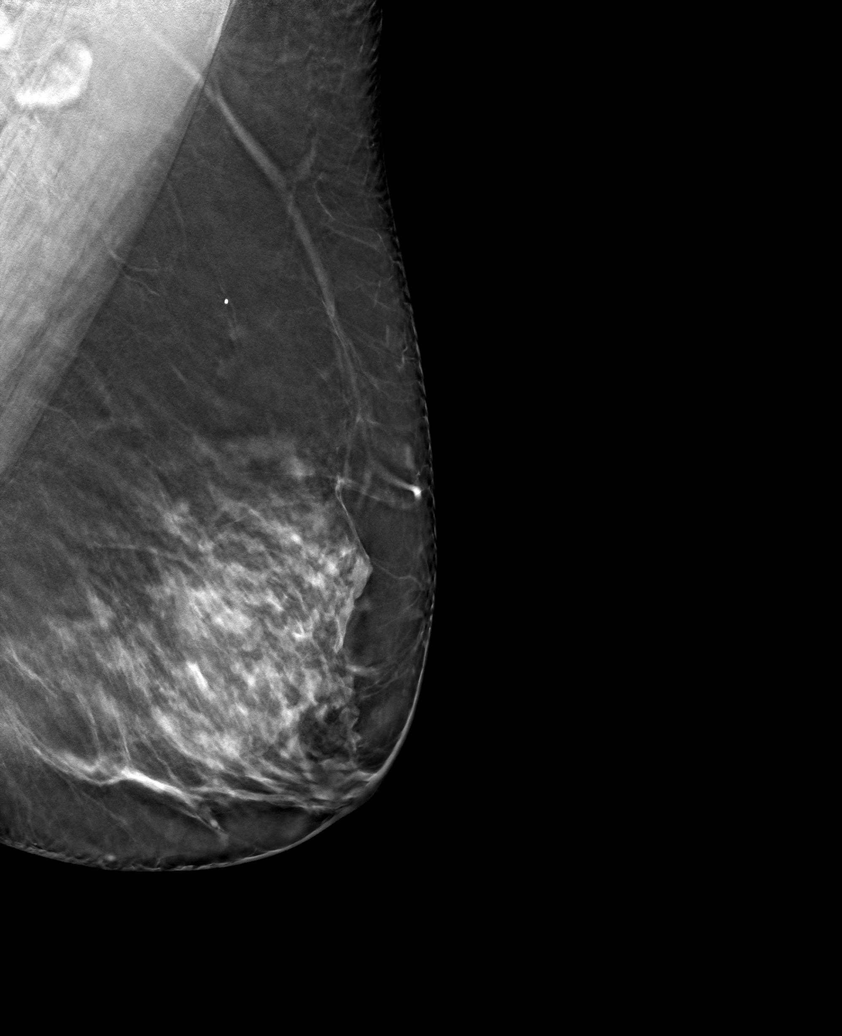

[L CC tomo (2 of 2) · tomo slice 29/58.0]
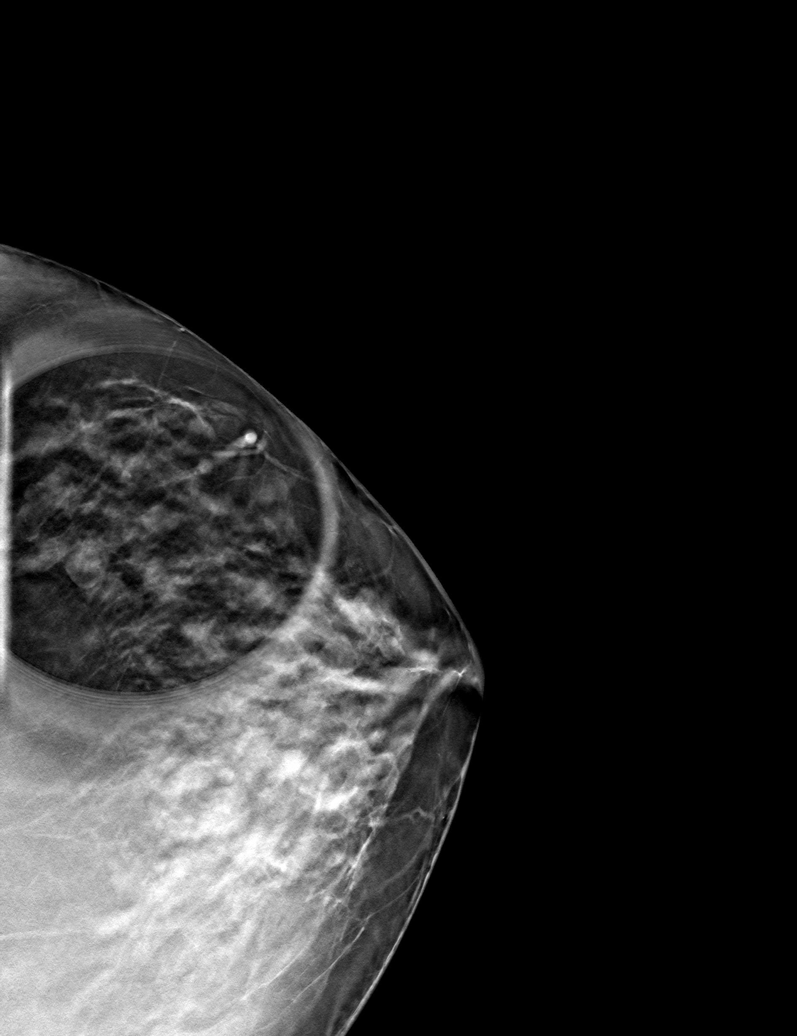

[6 of 18 positions shown; findings below may reference images not displayed]

ACR Breast Density Category c: The breast tissue is heterogeneously
dense, which may obscure small masses.
FINDINGS: Questioned asymmetry within the outer left breast on the cc view
appears to efface with additional imaging suggestive of dense
tissue. There is a persistent oval circumscribed mass within the
outer left breast middle depth.

Targeted ultrasound is performed, showing normal tissue without
suspicious mass within the outer left breast. Additionally there is
a 3 x 11 x 7 mm cyst left breast 2 o'clock position 5 cm from nipple
an adjacent 4 x 2 x 4 mm cyst left breast 2:30 o'clock 6 cm from the
nipple.
IMPRESSION: No mammographic evidence for malignancy.

RECOMMENDATION:
Screening mammogram in one year.(Code:4X-Q-WTL)

I have discussed the findings and recommendations with the patient.
If applicable, a reminder letter will be sent to the patient
regarding the next appointment.

BI-RADS CATEGORY  2: Benign.

## 2023-02-07 ENCOUNTER — Other Ambulatory Visit: Payer: Self-pay | Admitting: Obstetrics and Gynecology

## 2023-02-07 DIAGNOSIS — Z1231 Encounter for screening mammogram for malignant neoplasm of breast: Secondary | ICD-10-CM

## 2023-03-08 ENCOUNTER — Ambulatory Visit: Payer: No Typology Code available for payment source

## 2023-03-12 ENCOUNTER — Ambulatory Visit
Admission: RE | Admit: 2023-03-12 | Discharge: 2023-03-12 | Discharge status: Home / Self Care | Payer: No Typology Code available for payment source | Source: Ambulatory Visit | Attending: Obstetrics and Gynecology | Admitting: Obstetrics and Gynecology

## 2023-03-12 DIAGNOSIS — Z1231 Encounter for screening mammogram for malignant neoplasm of breast: Secondary | ICD-10-CM

## 2024-02-18 ENCOUNTER — Other Ambulatory Visit: Payer: Self-pay | Admitting: Obstetrics and Gynecology

## 2024-02-18 DIAGNOSIS — Z1231 Encounter for screening mammogram for malignant neoplasm of breast: Secondary | ICD-10-CM

## 2024-02-20 ENCOUNTER — Other Ambulatory Visit: Payer: Self-pay

## 2024-02-20 MED ORDER — WEGOVY 1.5 MG PO TABS
1.5000 mg | ORAL_TABLET | Freq: Every day | ORAL | 11 refills | Status: AC
  Filled 2024-02-20: qty 30, 30d supply, fill #0

## 2024-03-12 ENCOUNTER — Ambulatory Visit
